# Patient Record
Sex: Male | Born: 1975 | Race: White | Hispanic: No | Marital: Single
Health system: Southern US, Community
[De-identification: ages and names within clinical notes are randomized; demographics above are authoritative.]

---

## 2011-12-18 ENCOUNTER — Emergency Department: Payer: Self-pay | Admitting: Emergency Medicine

## 2011-12-18 LAB — CBC
HCT: 50.2 % (ref 40.0–52.0)
HGB: 17.5 g/dL (ref 13.0–18.0)
MCH: 30.5 pg (ref 26.0–34.0)
MCHC: 35 g/dL (ref 32.0–36.0)
MCV: 87 fL (ref 80–100)
Platelet: 342 10*3/uL (ref 150–440)
RBC: 5.76 10*6/uL (ref 4.40–5.90)
WBC: 14.9 10*3/uL — ABNORMAL HIGH (ref 3.8–10.6)

## 2011-12-18 LAB — COMPREHENSIVE METABOLIC PANEL
Albumin: 4.5 g/dL (ref 3.4–5.0)
Alkaline Phosphatase: 77 U/L (ref 50–136)
Anion Gap: 10 (ref 7–16)
BUN: 10 mg/dL (ref 7–18)
Calcium, Total: 9.4 mg/dL (ref 8.5–10.1)
Chloride: 103 mmol/L (ref 98–107)
Co2: 27 mmol/L (ref 21–32)
Glucose: 105 mg/dL — ABNORMAL HIGH (ref 65–99)
SGOT(AST): 36 U/L (ref 15–37)
SGPT (ALT): 35 U/L (ref 12–78)
Sodium: 140 mmol/L (ref 136–145)

## 2011-12-18 LAB — URINALYSIS, COMPLETE
Bilirubin,UR: NEGATIVE
Blood: NEGATIVE
Glucose,UR: NEGATIVE mg/dL (ref 0–75)
Leukocyte Esterase: NEGATIVE
Nitrite: NEGATIVE
Protein: 100
RBC,UR: 5 /HPF (ref 0–5)
Specific Gravity: 1.017 (ref 1.003–1.030)
Squamous Epithelial: NONE SEEN

## 2012-01-04 ENCOUNTER — Ambulatory Visit: Payer: Self-pay | Admitting: Surgery

## 2012-01-04 LAB — CBC WITH DIFFERENTIAL/PLATELET
Basophil #: 0.1 10*3/uL (ref 0.0–0.1)
Basophil %: 1.1 %
Eosinophil #: 0.5 10*3/uL (ref 0.0–0.7)
Eosinophil %: 5.9 %
Lymphocyte %: 36.1 %
MCH: 31.2 pg (ref 26.0–34.0)
MCHC: 35.1 g/dL (ref 32.0–36.0)
Monocyte #: 0.7 x10 3/mm (ref 0.2–1.0)
Monocyte %: 8.1 %
Neutrophil %: 48.8 %
Platelet: 380 10*3/uL (ref 150–440)
RBC: 5.21 10*6/uL (ref 4.40–5.90)

## 2012-01-04 LAB — BASIC METABOLIC PANEL
BUN: 11 mg/dL (ref 7–18)
Chloride: 102 mmol/L (ref 98–107)
Creatinine: 0.93 mg/dL (ref 0.60–1.30)
EGFR (African American): >60
Glucose: 92 mg/dL (ref 65–99)
Osmolality: 277 (ref 275–301)
Potassium: 3.6 mmol/L (ref 3.5–5.1)
Sodium: 139 mmol/L (ref 136–145)

## 2012-01-04 LAB — HEPATIC FUNCTION PANEL A (ARMC)
Alkaline Phosphatase: 76 U/L (ref 50–136)
Bilirubin, Direct: 0.1 mg/dL (ref 0.00–0.20)
Bilirubin,Total: 0.6 mg/dL (ref 0.2–1.0)
SGPT (ALT): 37 U/L (ref 12–78)
Total Protein: 7.8 g/dL (ref 6.4–8.2)

## 2012-01-08 ENCOUNTER — Ambulatory Visit: Payer: Self-pay | Admitting: Surgery

## 2012-01-09 LAB — PATHOLOGY REPORT

## 2014-01-03 IMAGING — US ABDOMEN ULTRASOUND LIMITED
1 series · 14 of 25 positions shown · non-contrast
Comparison: none

REASON FOR EXAM: ruq pain nausea and vomiting
COMMENTS:   Body Site: GB and Fossa, CBD, Head of Pancreas

PROCEDURE:     US  - US ABDOMEN LIMITED SURVEY  - December 19, 2011  [DATE]
RESULT:     Comparison: None
TECHNIQUE: Multiple gray-scale and color-flow Doppler images of the right
upper quadrant are presented for review.

[Series 1: abdomen ultrasound limited · 0.31mm/px · 14 of 42 slices shown]
[im 1/42]
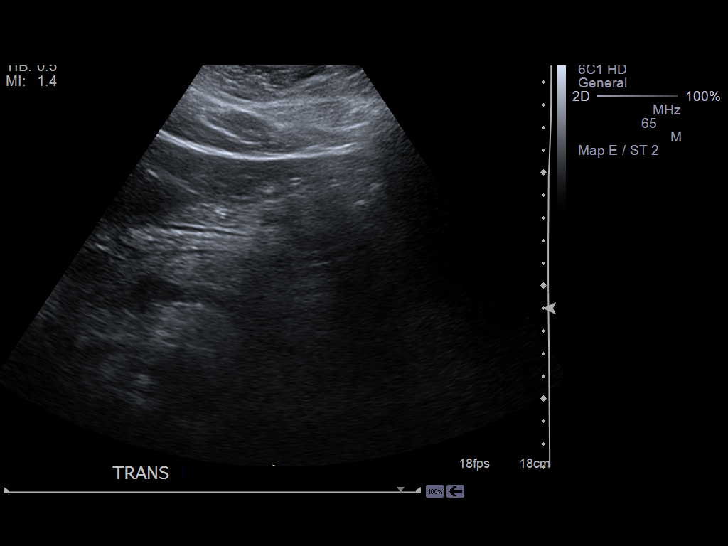
[im 4/42]
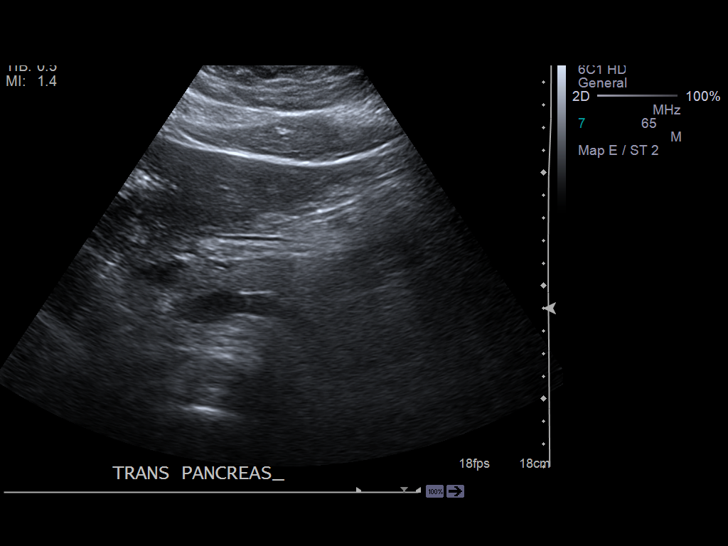
[im 7/42]
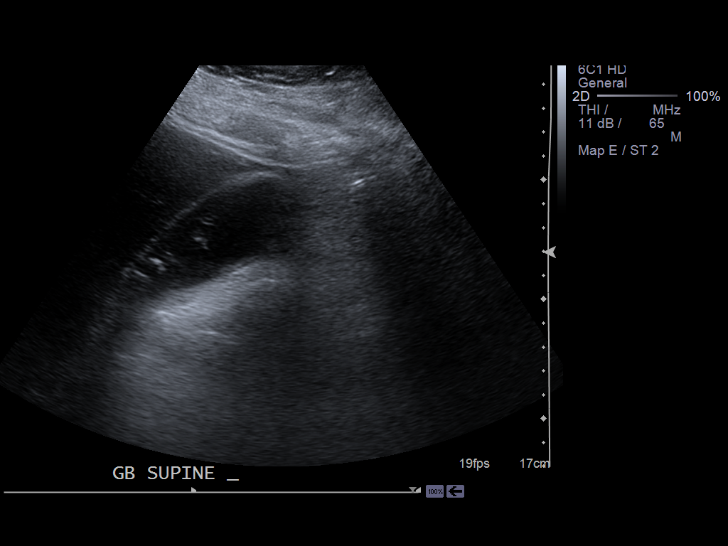
[im 11/42]
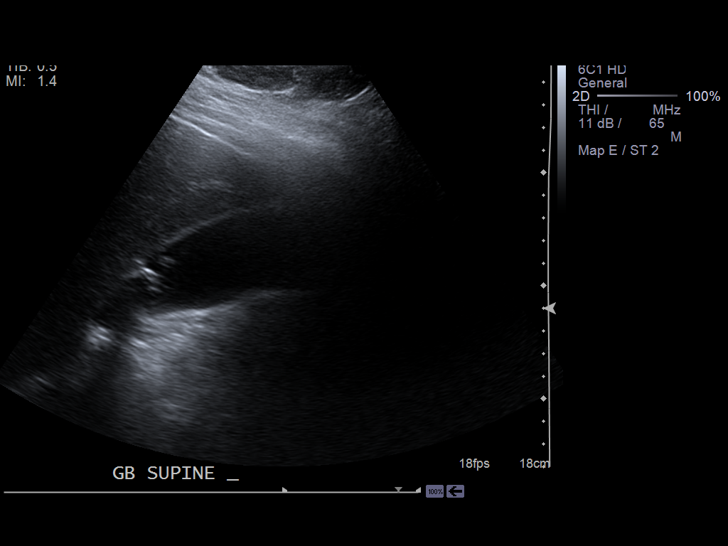
[im 14/42]
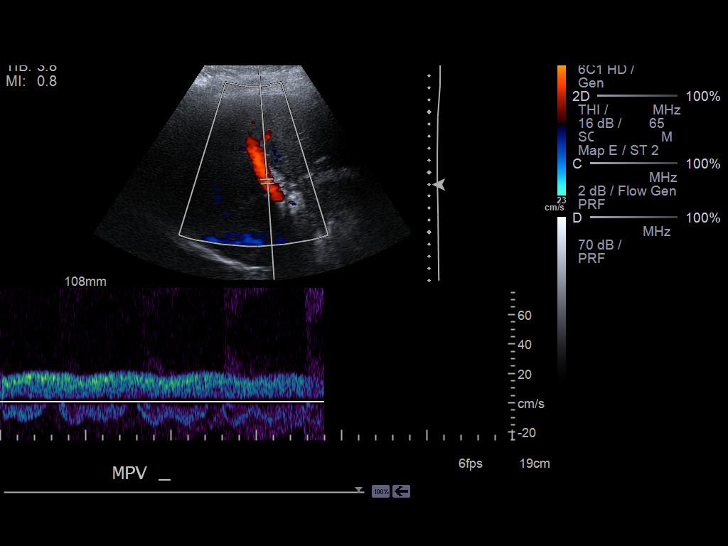
[im 16/42]
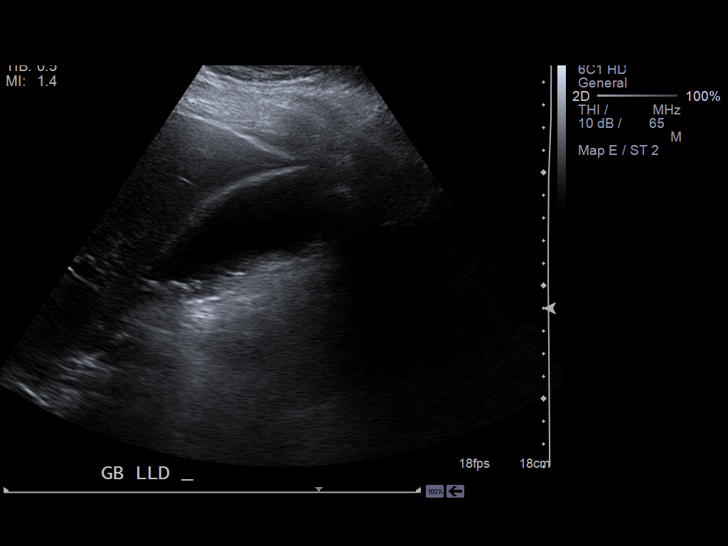
[im 19/42]
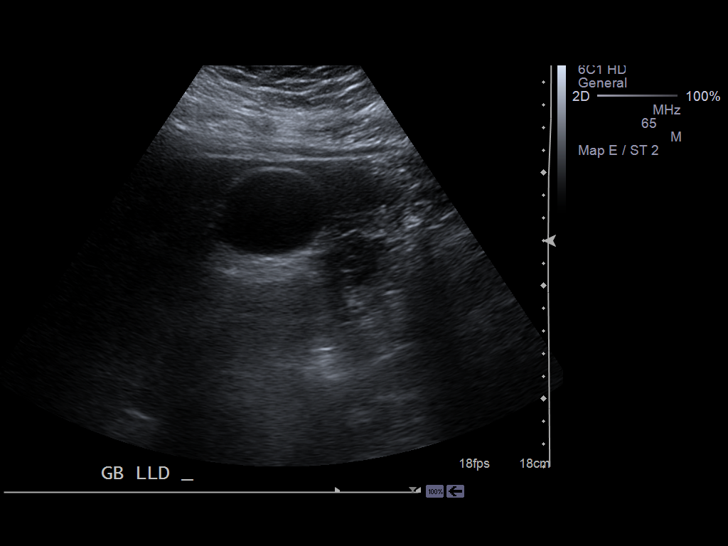
[im 23/42]
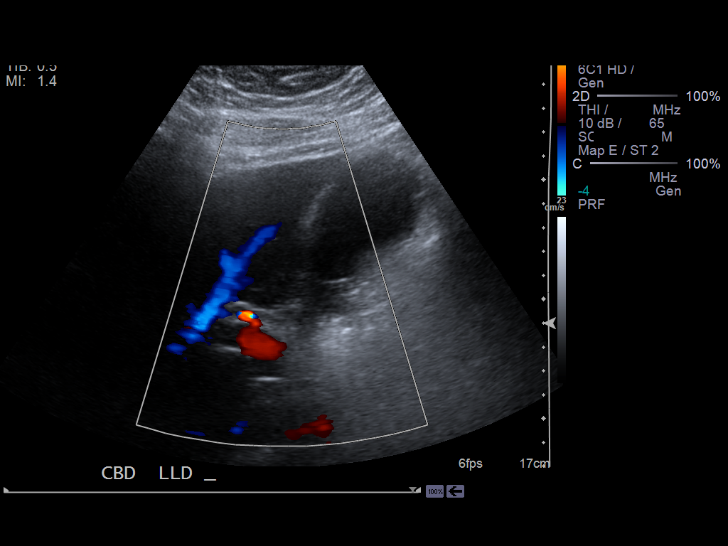
[im 26/42]
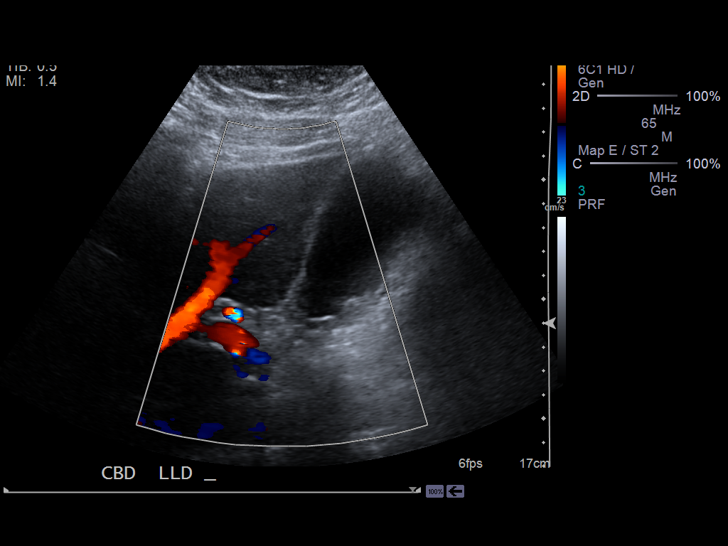
[im 28/42]
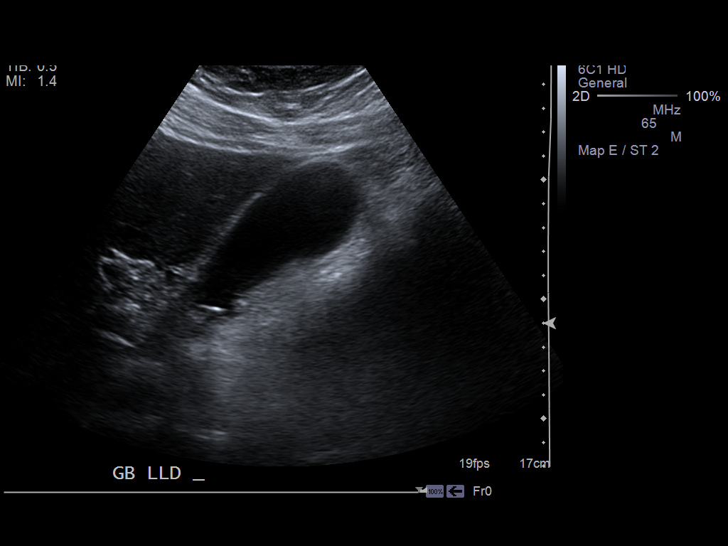
[im 31/42]
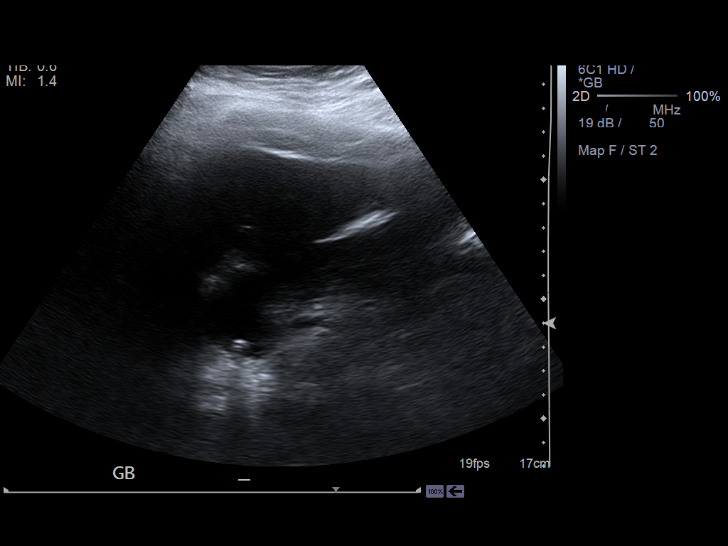
[im 35/42]
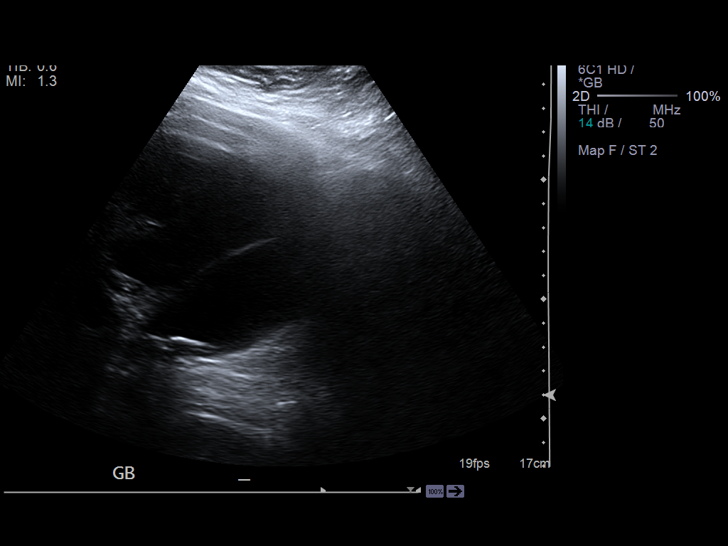
[im 38/42]
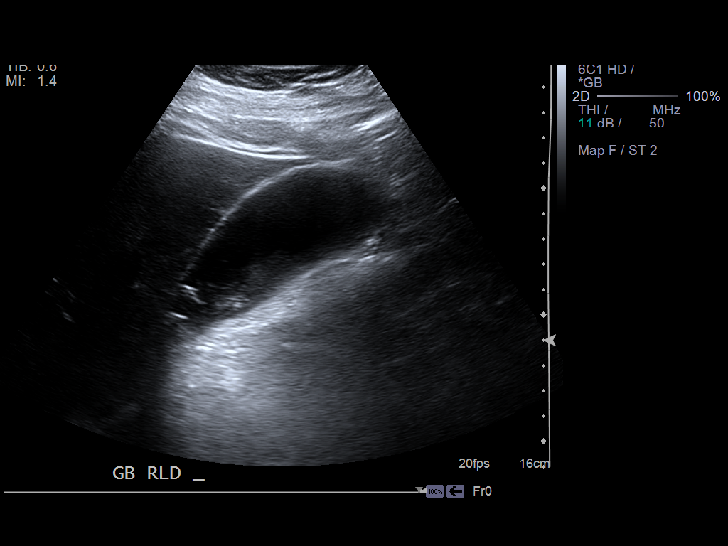
[im 42/42]
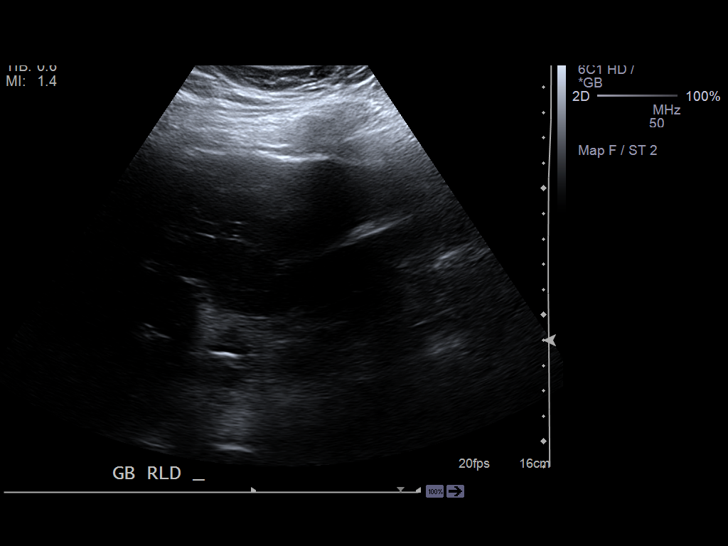

[14 of 25 positions shown; findings below may reference images not displayed]

FINDINGS: Visualized portions of the liver demonstrate normal echogenicity and normal
contours. The liver is without evidence of a focal hepatic lesion.

There are cholelithiasis. There is no intra- or extrahepatic biliary ductal
dilatation. The common duct measures 4.5 mm in maximal diameter. There is no
gallbladder wall thickening, pericholecystic fluid, or sonographic Murphy's
sign.

The visualized portion of the pancreas is normal in echogenicity.
IMPRESSION: Cholelithiasis without sonographic evidence of acute cholecystitis.

[REDACTED]

## 2014-06-16 NOTE — Op Note (Signed)
PATIENT NAME:  Jeffery LericheDEESE, [name] MR#:  [id number] DATE OF BIRTH:  Feb 16, 1976  DATE OF PROCEDURE:  01/08/2012  PREOPERATIVE DIAGNOSIS: Biliary colic.   POSTOPERATIVE DIAGNOSIS: Acute cholecystitis.  PROCEDURE PERFORMED: Laparoscopic cholecystectomy.   SURGEON: Dionne Miloichard Marshea Wisher, M.D.   ANESTHESIA: General with endotracheal tube.   INDICATIONS: This is a patient with recurrent episodes of right upper quadrant pain associated with fatty food intolerance and work-up showing gallstones with normal liver function tests. Preoperatively we discussed rationale for surgery, the options of observation, risks of bleeding, infection, recurrence of symptoms, failure to resolve the symptoms, open procedure, bile duct damage, bile duct leak, and retained common bile duct stone any of which could require further surgery and/or ERCP, stent, and papillotomy. This was all reviewed for he and his father in the preop holding area. They understood and agreed to proceed.   FINDINGS: Acute cholecystitis on chronic cholecystitis with edema and scar.   DESCRIPTION OF PROCEDURE: The patient was induced to general anesthesia. He was given IV antibiotics. VTE prophylaxis was in place. He was prepped and draped in a sterile fashion. Marcaine was infiltrated in skin and subcutaneous tissues around the periumbilical area. Incision was made. Veress needle was placed. Pneumoperitoneum was obtained. 5 mm trocar port was placed. The abdominal cavity was explored and under direct vision a 10 mm epigastric port and two lateral 5 mm ports were placed. Gallbladder was placed on tension. Adhesions were of the acute soft, filmy nature with edema which were taken down bluntly. The gallbladder was placed on further tension. The peritoneum over the infundibulum was incised bluntly. The cystic artery was well identified, doubly clipped, and divided. This allowed for good visualization of the cystic duct as it entered the infundibulum. Here it was  doubly clipped and divided and the gallbladder was taken from the gallbladder fossa with electrocautery and passed out through the epigastric port site with the aid of an Endo Catch bag. The area was checked for hemostasis and found to be adequate. There was no sign of bleeding, bile leak, or bowel injury. the camera was placed in the epigastric site to view back to the periumbilical site. There was no sign of bowel injury or bleeding. Again, the gallbladder fossa was inspected with no sign of bleeding, bowel injury, or bile leak. Therefore pneumoperitoneum was released, all ports were removed, fascial edges at the epigastric site were approximated with figure-of-eight 0 Vicryls, 4-0 subcuticular Monocryl was used on all skin edges, and Steri-Strips, Mastisol, and sterile dressings were placed.   The patient tolerated the procedure well. There were no complications. He was taken to the recovery room in stable condition to be discharged in the care of his family and followup in 10 days. ____________________________ Adah Salvageichard E. Excell Seltzerooper, MD rec:slb D: 01/08/2012 08:09:46 ET T: 01/08/2012 09:59:00 ET JOB#: [id number]  cc: Adah Salvageichard E. Excell Seltzerooper, MD, <Dictator> Lattie HawICHARD E Mikelle Myrick MD ELECTRONICALLY SIGNED 01/08/2012 11:49

## 2014-06-16 NOTE — H&P (Signed)
PATIENT NAME:  Jeffery Peterson, Jeffery Peterson MR#:  [id number] DATE OF BIRTH:  [birth year]  DATE OF ADMISSION:  01/08/2012  CHIEF COMPLAINT: Right upper quadrant pain.   HISTORY OF PRESENT ILLNESS: This is a 39 year old male. He has been seen in the office where a diagnosis of symptomatic biliary colic has been identified. He has an ultrasound showing stones and he has recurrent and episodic abdominal pain in the right upper quadrant with nausea, vomiting. No fevers or chills. There is fatty food intolerance and he has had no jaundice or acholic stools. He is here for elective laparoscopic cholecystectomy.   PAST MEDICAL HISTORY: None with the exception of obesity.   PAST SURGICAL HISTORY: None.   MEDICATIONS: Vicodin and Zofran.   ALLERGIES: None.   FAMILY HISTORY: Noncontributory.   REVIEW OF SYSTEMS: 10 system review has been performed and is documented in the office chart.   PHYSICAL EXAMINATION:  GENERAL: 290 pound patient 76 inches tall with a BMI of 35.   VITAL SIGNS: Temperature 97. Other vital signs are stable.   HEENT: No scleral icterus.   NECK: No palpable neck nodes.   CHEST: Clear to auscultation.   CARDIAC: Regular rate and rhythm.   ABDOMEN: Soft, nontender.   EXTREMITIES: Without edema. Calves are nontender.   NEUROLOGIC: Grossly intact.   INTEGUMENT: No jaundice.   LABORATORY, DIAGNOSTIC AND RADIOLOGICAL DATA: Laboratory values demonstrate no sign of choledocholithiasis with a normal hemoglobin and hematocrit.  An ultrasound shows stones and sludge.   ASSESSMENT AND PLAN: This is a patient with episodic right upper quadrant pain associated with fatty food intolerance and work-up showing gallstones. He is here for elective laparoscopic cholecystectomy. The rationale for surgery has been discussed with him, the options of observation have been reviewed and the risks of bleeding, infection, recurrence of symptoms, failure to resolve his symptoms, open procedure, bile duct  damage, bile duct leak, retained common bile duct stone, any of which could require further surgery and/or ERCP, stent, and papillotomy have all been discussed with him. He understood and agreed to proceed.   ____________________________ Adah Salvageichard E. Excell Seltzerooper, MD rec:cms D: 01/07/2012 18:45:25 ET T: 01/08/2012 06:43:13 ET JOB#: [id number]  cc: Adah Salvageichard E. Excell Seltzerooper, MD, <Dictator>  Lattie HawICHARD E COOPER MD ELECTRONICALLY SIGNED 01/08/2012 8:41
